# Patient Record
Sex: Male | Born: 1990 | Race: Black or African American | Hispanic: No | Marital: Single | Smoking: Current every day smoker
Health system: Southern US, Community
[De-identification: ages and names within clinical notes are randomized; demographics above are authoritative.]

## PROBLEM LIST (undated history)

## (undated) DIAGNOSIS — M67919 Unspecified disorder of synovium and tendon, unspecified shoulder: Secondary | ICD-10-CM

---

## 2015-05-18 ENCOUNTER — Emergency Department: Payer: Self-pay

## 2015-05-18 ENCOUNTER — Emergency Department
Admission: EM | Admit: 2015-05-18 | Discharge: 2015-05-18 | Disposition: A | Discharge status: Home / Self Care | Payer: Worker's Compensation | Attending: Emergency Medicine | Admitting: Emergency Medicine

## 2015-05-18 DIAGNOSIS — X501XXA Overexertion from prolonged static or awkward postures, initial encounter: Secondary | ICD-10-CM | POA: Insufficient documentation

## 2015-05-18 DIAGNOSIS — Y9389 Activity, other specified: Secondary | ICD-10-CM | POA: Insufficient documentation

## 2015-05-18 DIAGNOSIS — Y9289 Other specified places as the place of occurrence of the external cause: Secondary | ICD-10-CM | POA: Insufficient documentation

## 2015-05-18 DIAGNOSIS — Y990 Civilian activity done for income or pay: Secondary | ICD-10-CM | POA: Insufficient documentation

## 2015-05-18 DIAGNOSIS — S4991XA Unspecified injury of right shoulder and upper arm, initial encounter: Secondary | ICD-10-CM | POA: Insufficient documentation

## 2015-05-18 DIAGNOSIS — M25511 Pain in right shoulder: Secondary | ICD-10-CM

## 2015-05-18 MED ORDER — CYCLOBENZAPRINE HCL 10 MG PO TABS: 10.0000 mg | ORAL_TABLET | Freq: Three times a day (TID) | ORAL | Status: DC | PRN

## 2015-05-18 NOTE — ED Notes (Signed)
Pt charting done during down-time for [facility]. See paper chart for more information.

## 2015-05-18 NOTE — Discharge Instructions (Signed)

## 2015-05-18 NOTE — ED Provider Notes (Signed)
[facility] Emergency Department Provider Note  ____________________________________________  Time seen: 2:20 AM  I have reviewed the triage vital signs and the nursing notes.   HISTORY  Chief Complaint No chief complaint on file.    HPI Dwayne Torres is a 24 y.o. male presents with 7 out of 10 right anterior shoulder pain with onset tonight. 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 Pain is worse with movement of the right shoulder    Past medical history None There are no active problems to display for this patient.   Past surgical history None  Current Outpatient Rx  Name  Route  Sig  Dispense  Refill  . cyclobenzaprine (FLEXERIL) 10 MG tablet   Oral   Take 1 tablet (10 mg total) by mouth every 8 (eight) hours as needed for muscle spasms.   30 tablet   1     Allergies Review of patient's allergies indicates not on file.  No family history on file.  Social History Social History  Substance Use Topics  . Smoking status: Not on file  . Smokeless tobacco: Not on file  . Alcohol Use: Not on file    Review of Systems  Constitutional: Negative for fever. Eyes: Negative for visual changes. ENT: Negative for sore throat. Cardiovascular: Negative for chest pain. Respiratory: Negative for shortness of breath. Gastrointestinal: Negative for abdominal pain, vomiting and diarrhea. Genitourinary: Negative for dysuria. Musculoskeletal: Negative for back pain. Positive right shoulder pain Skin: Negative for rash. Neurological: Negative for headaches, focal weakness or numbness.   10-point ROS otherwise negative.  ____________________________________________   PHYSICAL EXAM:  VITAL SIGNS: ED Triage Vitals  Enc Vitals Group     BP --      Pulse --      Resp --      Temp --      Temp src --      SpO2 --      Weight --      Height --      Head Cir  --      Peak Flow --      Pain Score --      Pain Loc --      Pain Edu? --      Excl. in GC? --      Constitutional: Alert and oriented. Well appearing and in no distress. Eyes: Conjunctivae are normal. PERRL. Normal extraocular movements. ENT   Head: Normocephalic and atraumatic.   Nose: No congestion/rhinnorhea.   Mouth/Throat: Mucous membranes are moist.   Neck: No stridor. Hematological/Lymphatic/Immunilogical: No cervical lymphadenopathy. Cardiovascular: Normal rate, regular rhythm. Normal and symmetric distal pulses are present in all extremities. No murmurs, rubs, or gallops. Respiratory: Normal respiratory effort without tachypnea nor retractions. Breath sounds are clear and equal bilaterally. No wheezes/rales/rhonchi. Gastrointestinal: Soft and nontender. No distention. There is no CVA tenderness. Genitourinary: deferred Musculoskeletal: Nontender with normal range of motion in all extremities. No joint effusions.  No lower extremity tenderness nor edema. Pain with palpation along the pectoralis major minor and anterior right shoulder    RADIOLOGY        DG Shoulder Right (Final result) Result time: 05/18/15 03:07:11   Final result by Rad Results In Interface (05/18/15 03:07:11)   Narrative:   CLINICAL DATA: Right shoulder pain after hearing a popping sound at work tonight.  EXAM: RIGHT SHOULDER - 2+ VIEW  COMPARISON: None.  FINDINGS: There is no evidence of fracture or dislocation. There is no evidence of arthropathy or other focal bone abnormality. Soft tissues are unremarkable.  IMPRESSION: Negative.   Electronically Signed By: Burman Nieves M.D. On: 05/18/2015 03:07            INITIAL IMPRESSION / ASSESSMENT AND PLAN / ED COURSE  Pertinent labs & imaging results that were available during my care of the patient were reviewed by me and considered in my medical decision making (see chart for details).  History and  physical exam consistent with possible pectoralis minor strain versus rotator cuff injury. Patient received a Percocet 5 mg tablet as well as a shoulder sling. Advised to follow-up with Dr. Martha Clan pain persists ____________________________________________   FINAL CLINICAL IMPRESSION(S) / ED DIAGNOSES  Final diagnoses:  Right shoulder injury, initial encounter  Right shoulder pain      Darci Current, MD 05/18/15 [phone]

## 2015-05-20 ENCOUNTER — Other Ambulatory Visit: Payer: Self-pay | Admitting: Orthopedic Surgery

## 2015-05-20 DIAGNOSIS — M25511 Pain in right shoulder: Secondary | ICD-10-CM

## 2015-06-10 ENCOUNTER — Ambulatory Visit
Admission: RE | Admit: 2015-06-10 | Discharge: 2015-06-10 | Disposition: A | Discharge status: Home / Self Care | Payer: Self-pay | Source: Ambulatory Visit | Attending: Orthopedic Surgery | Admitting: Orthopedic Surgery

## 2015-06-10 DIAGNOSIS — M25511 Pain in right shoulder: Secondary | ICD-10-CM | POA: Insufficient documentation

## 2016-03-01 ENCOUNTER — Encounter: Payer: Self-pay | Admitting: Emergency Medicine

## 2016-03-01 ENCOUNTER — Emergency Department
Admission: EM | Admit: 2016-03-01 | Discharge: 2016-03-02 | Disposition: A | Discharge status: Home / Self Care | Payer: Self-pay | Attending: Emergency Medicine | Admitting: Emergency Medicine

## 2016-03-01 DIAGNOSIS — M67911 Unspecified disorder of synovium and tendon, right shoulder: Secondary | ICD-10-CM

## 2016-03-01 DIAGNOSIS — M25811 Other specified joint disorders, right shoulder: Secondary | ICD-10-CM | POA: Insufficient documentation

## 2016-03-01 DIAGNOSIS — Z5181 Encounter for therapeutic drug level monitoring: Secondary | ICD-10-CM | POA: Insufficient documentation

## 2016-03-01 DIAGNOSIS — R451 Restlessness and agitation: Secondary | ICD-10-CM | POA: Insufficient documentation

## 2016-03-01 DIAGNOSIS — F17210 Nicotine dependence, cigarettes, uncomplicated: Secondary | ICD-10-CM | POA: Insufficient documentation

## 2016-03-01 DIAGNOSIS — M67919 Unspecified disorder of synovium and tendon, unspecified shoulder: Secondary | ICD-10-CM | POA: Insufficient documentation

## 2016-03-01 HISTORY — DX: Unspecified disorder of synovium and tendon, unspecified shoulder: M67.919

## 2016-03-01 LAB — CBC WITH DIFFERENTIAL/PLATELET
BASOS PCT: 1 %
Basophils Absolute: 0.1 10*3/uL (ref 0–0.1)
EOS ABS: 0.1 10*3/uL (ref 0–0.7)
Eosinophils Relative: 3 %
HEMATOCRIT: 46.3 % (ref 40.0–52.0)
HEMOGLOBIN: 15.4 g/dL (ref 13.0–18.0)
Lymphocytes Relative: 34 %
Lymphs Abs: 1.7 10*3/uL (ref 1.0–3.6)
MCH: 27 pg (ref 26.0–34.0)
MCHC: 33.3 g/dL (ref 32.0–36.0)
MCV: 81.1 fL (ref 80.0–100.0)
MONO ABS: 0.5 10*3/uL (ref 0.2–1.0)
MONOS PCT: 10 %
NEUTROS ABS: 2.6 10*3/uL (ref 1.4–6.5)
Neutrophils Relative %: 52 %
Platelets: 134 10*3/uL — ABNORMAL LOW (ref 150–440)
RBC: 5.71 MIL/uL (ref 4.40–5.90)
RDW: 14.3 % (ref 11.5–14.5)
WBC: 5.1 10*3/uL (ref 3.8–10.6)

## 2016-03-01 LAB — URINE DRUG SCREEN, QUALITATIVE (ARMC ONLY)
AMPHETAMINES, UR SCREEN: NOT DETECTED
BENZODIAZEPINE, UR SCRN: NOT DETECTED
Barbiturates, Ur Screen: NOT DETECTED
Cannabinoid 50 Ng, Ur ~~LOC~~: NOT DETECTED
Cocaine Metabolite,Ur ~~LOC~~: NOT DETECTED
MDMA (ECSTASY) UR SCREEN: NOT DETECTED
METHADONE SCREEN, URINE: NOT DETECTED
Opiate, Ur Screen: NOT DETECTED
Phencyclidine (PCP) Ur S: NOT DETECTED
Tricyclic, Ur Screen: NOT DETECTED

## 2016-03-01 LAB — COMPREHENSIVE METABOLIC PANEL
ALBUMIN: 4.3 g/dL (ref 3.5–5.0)
ALK PHOS: 44 U/L (ref 38–126)
ALT: 19 U/L (ref 17–63)
ANION GAP: 6 (ref 5–15)
AST: 27 U/L (ref 15–41)
BILIRUBIN TOTAL: 0.9 mg/dL (ref 0.3–1.2)
BUN: 19 mg/dL (ref 6–20)
CALCIUM: 9.8 mg/dL (ref 8.9–10.3)
CO2: 26 mmol/L (ref 22–32)
Chloride: 108 mmol/L (ref 101–111)
Creatinine, Ser: 1.61 mg/dL — ABNORMAL HIGH (ref 0.61–1.24)
GFR calc non Af Amer: 57 mL/min — ABNORMAL LOW (ref >60)
Glucose, Bld: 112 mg/dL — ABNORMAL HIGH (ref 65–99)
POTASSIUM: 3.5 mmol/L (ref 3.5–5.1)
SODIUM: 140 mmol/L (ref 135–145)
TOTAL PROTEIN: 7.5 g/dL (ref 6.5–8.1)

## 2016-03-01 LAB — ETHANOL

## 2016-03-01 NOTE — ED Provider Notes (Addendum)
[facility] Emergency Department Provider Note        Time seen: ----------------------------------------- 9:18 PM on 03/01/2016 -----------------------------------------    I have reviewed the triage vital signs and the nursing notes.   HISTORY  Chief Complaint No chief complaint on file.    HPI Dwayne Torres is a 25 y.o. male brought the ER escorted by police for involuntary commitment. Patient is said to his family that he wanted to kill himself. Patient states he been stressed out because his family has been "on him for a long time about different things". Patient states his family blames him for things and accuses him of things and treats him like he is dumb. Patient became aggressive and was very upset tonight, did not become combative until police informed him he had to be evaluated for involuntary commitment.   No past medical history on file.  There are no active problems to display for this patient.   No past surgical history on file.  Allergies Review of patient's allergies indicates not on file.  Social History Social History  Substance Use Topics  . Smoking status: Not on file  . Smokeless tobacco: Not on file  . Alcohol use Not on file    Review of Systems Constitutional: Negative for fever. Cardiovascular: Negative for chest pain. Respiratory: Negative for shortness of breath. Gastrointestinal: Negative for abdominal pain, vomiting and diarrhea. Genitourinary: Negative for dysuria. Musculoskeletal: Negative for back pain. Skin: Negative for rash. Neurological: Negative for headaches, focal weakness or numbness.  10-point ROS otherwise negative.  ____________________________________________   PHYSICAL EXAM:  VITAL SIGNS: ED Triage Vitals  Enc Vitals Group     BP      Pulse      Resp      Temp      Temp src      SpO2      Weight      Height      Head Circumference      Peak Flow      Pain Score      Pain Loc       Pain Edu?      Excl. in GC?     Constitutional: Alert and oriented. Well appearing and in no distress. Eyes: Conjunctivae are normal. PERRL. Normal extraocular movements. ENT   Head: Normocephalic and atraumatic.   Nose: No congestion/rhinnorhea.   Mouth/Throat: Mucous membranes are moist.   Neck: No stridor. Cardiovascular: Normal rate, regular rhythm. No murmurs, rubs, or gallops. Respiratory: Normal respiratory effort without tachypnea nor retractions. Breath sounds are clear and equal bilaterally. No wheezes/rales/rhonchi. Gastrointestinal: Soft and nontender. Normal bowel sounds Musculoskeletal: Nontender with normal range of motion in all extremities. No lower extremity tenderness nor edema. Neurologic:  Normal speech and language. No gross focal neurologic deficits are appreciated.  Skin:  Skin is warm, dry and intact. No rash noted. Psychiatric: Mood and affect are normal. Speech and behavior are normal.  ___________________________________________  ED COURSE:  Pertinent labs & imaging results that were available during my care of the patient were reviewed by me and considered in my medical decision making (see chart for details). Clinical Course  Patient presents to the ER in no acute distress, we will check basic labs, consult Tele-psychiatry.  Procedures ____________________________________________   LABS (pertinent positives/negatives)  Labs Reviewed  CBC WITH DIFFERENTIAL/PLATELET - Abnormal; Notable for the following:       Result Value   Platelets 134 (*)    All other  components within normal limits  COMPREHENSIVE METABOLIC PANEL - Abnormal; Notable for the following:    Glucose, Bld 112 (*)    Creatinine, Ser 1.61 (*)    GFR calc non Af Amer 57 (*)    All other components within normal limits  URINE DRUG SCREEN, QUALITATIVE ([facility] ONLY)  ETHANOL    ____________________________________________  FINAL ASSESSMENT AND PLAN  Agitation,  suicidal ideation  Plan: Patient with labs and imaging as dictated above. Patient is medically stable for psychiatric evaluation and disposition.   Emily Filbert, MD  Patient has been evaluated by Tele-psychiatry and the commitment will be overturned. He is stable for outpatient follow-up. Note: This dictation was prepared with Dragon dictation. Any transcriptional errors that result from this process are unintentional    Emily Filbert, MD 03/01/16 0347    Emily Filbert, MD 03/01/16 [phone]

## 2016-03-01 NOTE — ED Notes (Signed)
This RN went out and spoke with pt's family and explained the behavioral medicine visitation hours and the process. With the permission of the patient I gave the family in the lobby (mother and sister) the patient's password and placed the password on the patient's chart.

## 2016-03-01 NOTE — ED Notes (Signed)
Pt dressed out in burgundy scrubs by Steven,RN.

## 2016-03-01 NOTE — ED Triage Notes (Signed)
[place] [facility] [employer] [place] [facility]C. Pt reportedly called them stating that he was having thoughts of killing himself. When they arrived pt was out in the yard and was initially uncooperative. After about 5 minutes during the transport to the ER, pt calmed down. Pt arrived to the ER calm and cooperative.

## 2016-03-01 NOTE — ED Notes (Signed)
Pt given meal tray and sprite at this time.Pt cooperative and calm while talking with this tech.

## 2016-03-02 NOTE — ED Notes (Signed)
Patient discharged to home per MD order. Patient in stable condition, and deemed medically cleared by ED provider for discharge. Discharge instructions reviewed with patient/family using "Teach Back"; verbalized understanding of medication education and administration, and information about follow-up care. Denies further concerns. ° °

## 2017-05-21 IMAGING — CR DG SHOULDER 2+V*R*
3 series · 3 of 3 positions shown · non-contrast
Comparison: None.

CLINICAL DATA: Right shoulder pain after hearing a popping sound at
work tonight.

EXAM:
RIGHT SHOULDER - 2+ VIEW

[shoulder grashey]
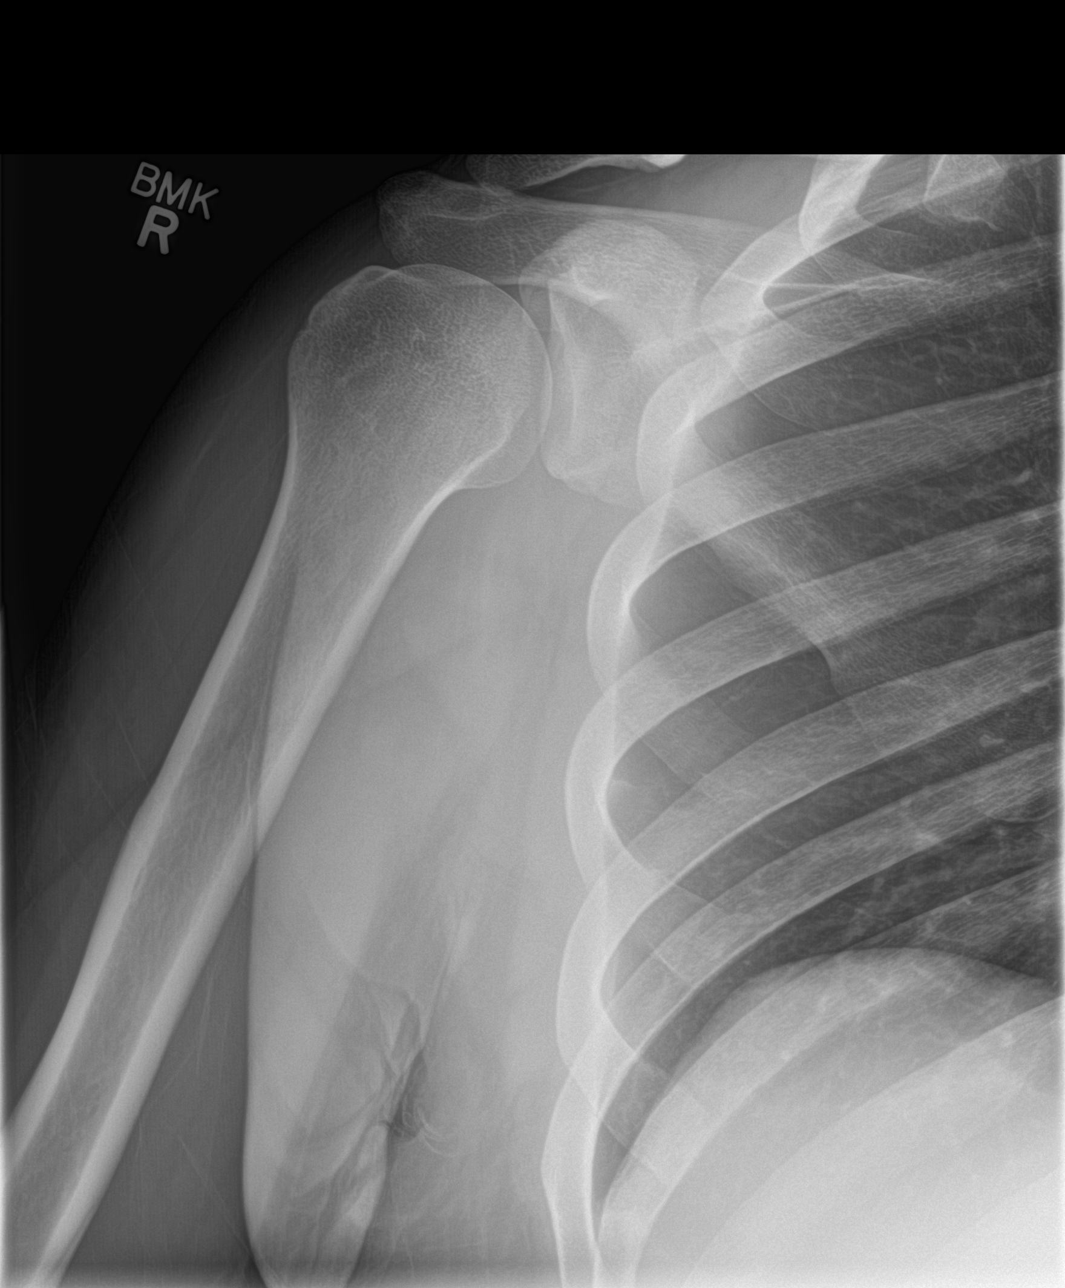

[shoulder y view]
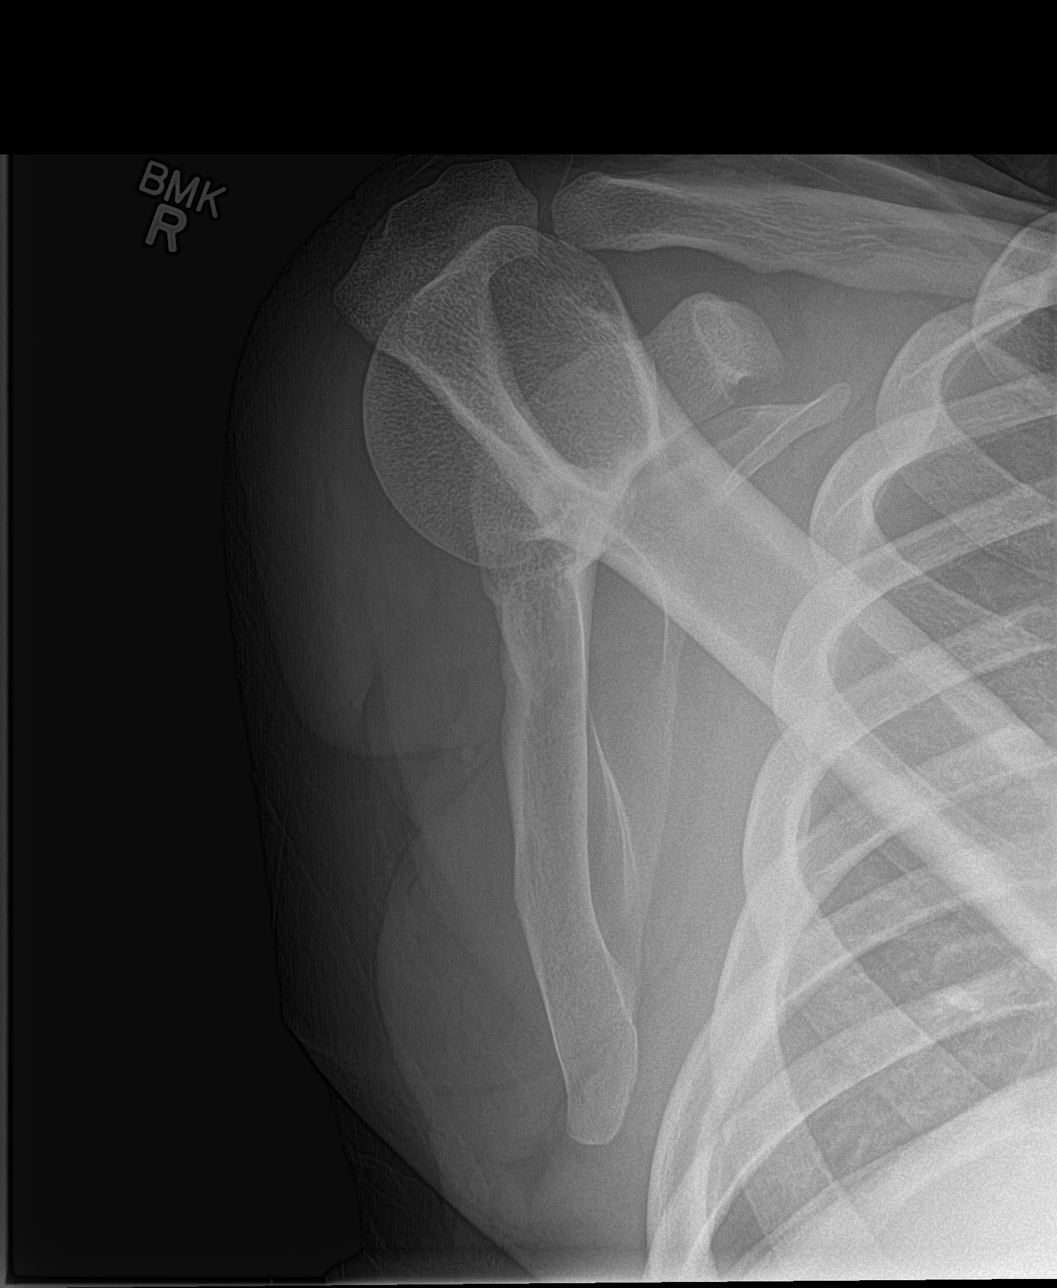

[shoulder axillary]
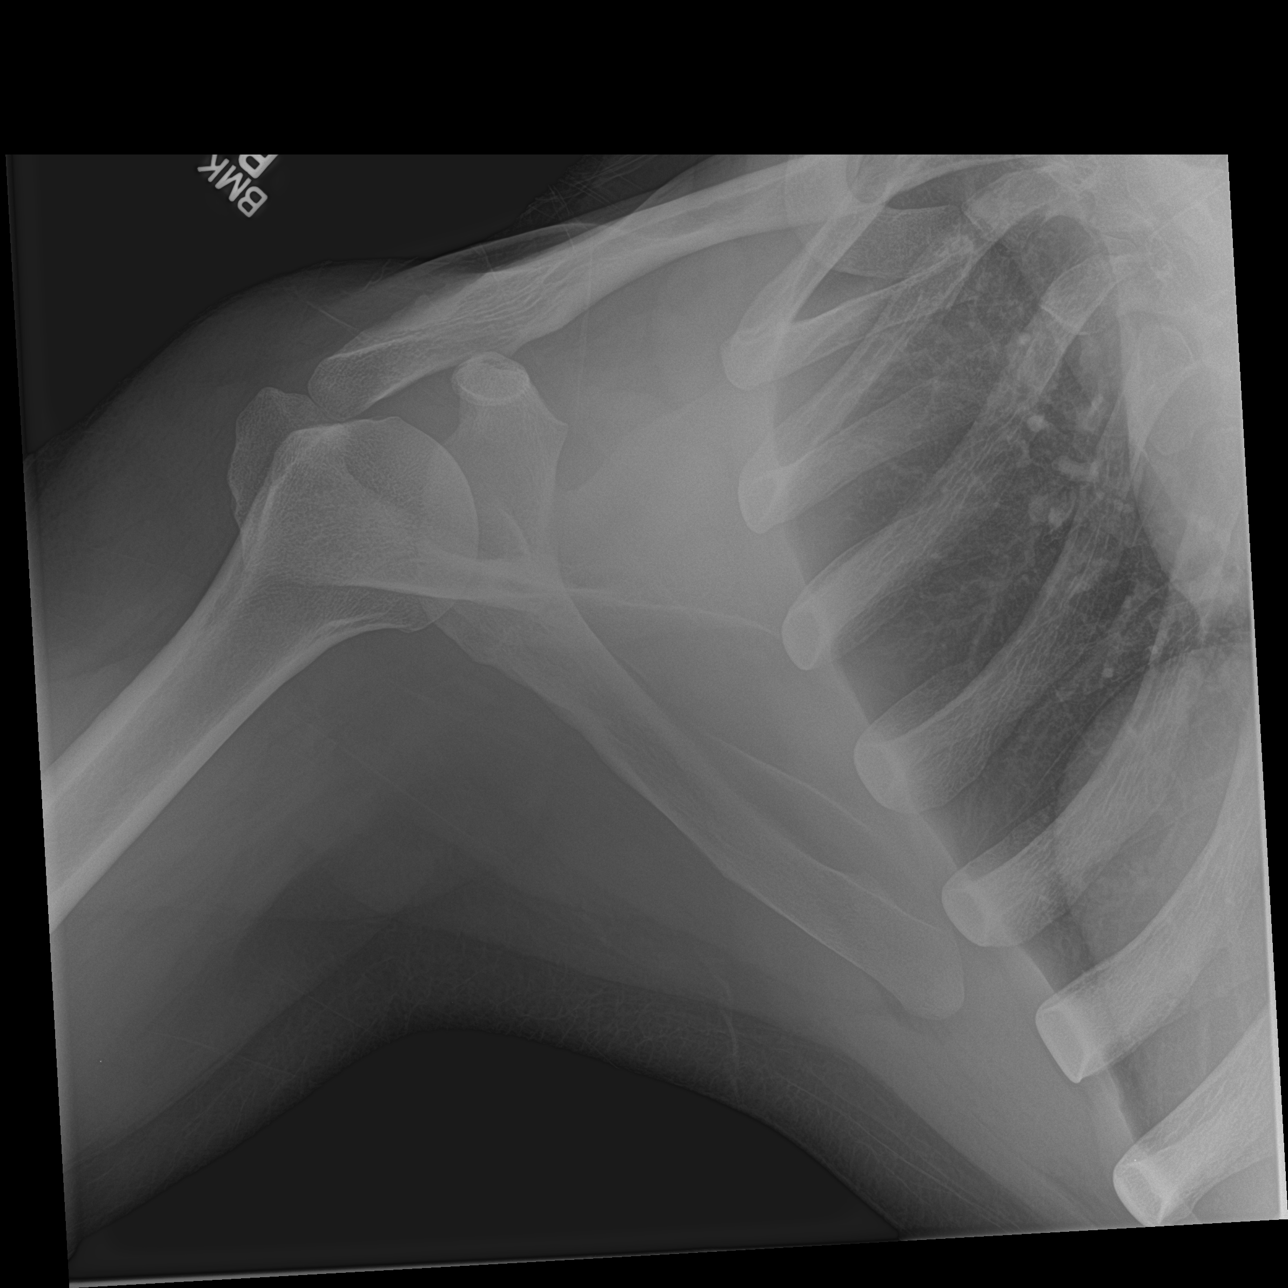

[3 of 3 positions shown; findings below may reference images not displayed]

FINDINGS: There is no evidence of fracture or dislocation. There is no
evidence of arthropathy or other focal bone abnormality. Soft
tissues are unremarkable.
IMPRESSION: Negative.

## 2017-06-11 ENCOUNTER — Emergency Department: Payer: Self-pay

## 2017-06-11 ENCOUNTER — Emergency Department
Admission: EM | Admit: 2017-06-11 | Discharge: 2017-06-11 | Disposition: A | Discharge status: Home / Self Care | Payer: Self-pay | Attending: Emergency Medicine | Admitting: Emergency Medicine

## 2017-06-11 ENCOUNTER — Encounter: Payer: Self-pay | Admitting: Emergency Medicine

## 2017-06-11 ENCOUNTER — Other Ambulatory Visit: Payer: Self-pay

## 2017-06-11 DIAGNOSIS — Y999 Unspecified external cause status: Secondary | ICD-10-CM | POA: Insufficient documentation

## 2017-06-11 DIAGNOSIS — Y9389 Activity, other specified: Secondary | ICD-10-CM | POA: Insufficient documentation

## 2017-06-11 DIAGNOSIS — Y92410 Unspecified street and highway as the place of occurrence of the external cause: Secondary | ICD-10-CM | POA: Insufficient documentation

## 2017-06-11 DIAGNOSIS — F17210 Nicotine dependence, cigarettes, uncomplicated: Secondary | ICD-10-CM | POA: Insufficient documentation

## 2017-06-11 DIAGNOSIS — S62306A Unspecified fracture of fifth metacarpal bone, right hand, initial encounter for closed fracture: Secondary | ICD-10-CM | POA: Insufficient documentation

## 2017-06-11 DIAGNOSIS — S0990XA Unspecified injury of head, initial encounter: Secondary | ICD-10-CM | POA: Insufficient documentation

## 2017-06-11 MED ORDER — OXYCODONE-ACETAMINOPHEN 5-325 MG PO TABS: 1.0000 | ORAL_TABLET | Freq: Three times a day (TID) | ORAL | 0 refills | Status: AC | PRN

## 2017-06-11 MED ORDER — OXYCODONE-ACETAMINOPHEN 5-325 MG PO TABS
2.0000 | ORAL_TABLET | Freq: Once | ORAL | Status: AC
Start: 2017-06-11 — End: 2017-06-11
  Administered 2017-06-11: 2 via ORAL
  Filled 2017-06-11: qty 2
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
## 2017-06-11 NOTE — ED Notes (Signed)
Patient transported to CT 
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
## 2022-02-21 DIAGNOSIS — R69 Illness, unspecified: Secondary | ICD-10-CM | POA: Diagnosis not present

## 2022-02-21 DIAGNOSIS — M25511 Pain in right shoulder: Secondary | ICD-10-CM | POA: Diagnosis not present

## 2022-02-21 DIAGNOSIS — Y990 Civilian activity done for income or pay: Secondary | ICD-10-CM | POA: Diagnosis not present

## 2022-02-21 DIAGNOSIS — X500XXA Overexertion from strenuous movement or load, initial encounter: Secondary | ICD-10-CM | POA: Diagnosis not present

## 2022-02-22 DIAGNOSIS — M25511 Pain in right shoulder: Secondary | ICD-10-CM | POA: Diagnosis not present
